# Patient Record
Sex: Male | Born: 1975 | Hispanic: Yes | Marital: Single | Smoking: Current some day smoker
Health system: Southern US, Community
[De-identification: ages and names within clinical notes are randomized; demographics above are authoritative.]

---

## 2015-07-19 ENCOUNTER — Emergency Department (HOSPITAL_COMMUNITY)
Admission: EM | Admit: 2015-07-19 | Discharge: 2015-07-19 | Disposition: A | Discharge status: Home / Self Care | Payer: Self-pay | Attending: Emergency Medicine | Admitting: Emergency Medicine

## 2015-07-19 ENCOUNTER — Encounter (HOSPITAL_COMMUNITY): Payer: Self-pay

## 2015-07-19 DIAGNOSIS — R740 Nonspecific elevation of levels of transaminase and lactic acid dehydrogenase [LDH]: Secondary | ICD-10-CM | POA: Insufficient documentation

## 2015-07-19 DIAGNOSIS — F17210 Nicotine dependence, cigarettes, uncomplicated: Secondary | ICD-10-CM | POA: Insufficient documentation

## 2015-07-19 DIAGNOSIS — K2970 Gastritis, unspecified, without bleeding: Secondary | ICD-10-CM | POA: Insufficient documentation

## 2015-07-19 DIAGNOSIS — R7401 Elevation of levels of liver transaminase levels: Secondary | ICD-10-CM

## 2015-07-19 LAB — CBC
HEMATOCRIT: 45.8 % (ref 39.0–52.0)
HEMOGLOBIN: 16.3 g/dL (ref 13.0–17.0)
MCH: 31.7 pg (ref 26.0–34.0)
MCHC: 35.6 g/dL (ref 30.0–36.0)
MCV: 88.9 fL (ref 78.0–100.0)
Platelets: 241 10*3/uL (ref 150–400)
RBC: 5.15 MIL/uL (ref 4.22–5.81)
RDW: 12.5 % (ref 11.5–15.5)
WBC: 8.4 10*3/uL (ref 4.0–10.5)

## 2015-07-19 LAB — COMPREHENSIVE METABOLIC PANEL
ALBUMIN: 4.4 g/dL (ref 3.5–5.0)
ALK PHOS: 82 U/L (ref 38–126)
ALT: 68 U/L — ABNORMAL HIGH (ref 17–63)
AST: 65 U/L — AB (ref 15–41)
Anion gap: 8 (ref 5–15)
BILIRUBIN TOTAL: 0.8 mg/dL (ref 0.3–1.2)
BUN: 6 mg/dL (ref 6–20)
CHLORIDE: 102 mmol/L (ref 101–111)
CO2: 26 mmol/L (ref 22–32)
Calcium: 9.5 mg/dL (ref 8.9–10.3)
Creatinine, Ser: 0.76 mg/dL (ref 0.61–1.24)
GFR calc Af Amer: >60 mL/min (ref >60)
GFR calc non Af Amer: >60 mL/min (ref >60)
GLUCOSE: 134 mg/dL — AB (ref 65–99)
POTASSIUM: 4.1 mmol/L (ref 3.5–5.1)
Sodium: 136 mmol/L (ref 135–145)
TOTAL PROTEIN: 7.9 g/dL (ref 6.5–8.1)

## 2015-07-19 LAB — LIPASE, BLOOD: Lipase: 18 U/L (ref 11–51)

## 2015-07-19 LAB — URINALYSIS, ROUTINE W REFLEX MICROSCOPIC
Bilirubin Urine: NEGATIVE
GLUCOSE, UA: NEGATIVE mg/dL
Hgb urine dipstick: NEGATIVE
KETONES UR: NEGATIVE mg/dL
LEUKOCYTES UA: NEGATIVE
NITRITE: NEGATIVE
PH: 8 (ref 5.0–8.0)
Protein, ur: NEGATIVE mg/dL
SPECIFIC GRAVITY, URINE: 1.016 (ref 1.005–1.030)

## 2015-07-19 MED ORDER — OMEPRAZOLE 20 MG PO CPDR: 20.0000 mg | DELAYED_RELEASE_CAPSULE | Freq: Two times a day (BID) | ORAL | Status: DC

## 2015-07-19 MED ORDER — ONDANSETRON 4 MG PO TBDP
4.0000 mg | ORAL_TABLET | Freq: Once | ORAL | Status: AC | PRN
  Administered 2015-07-19: 4 mg via ORAL

## 2015-07-19 MED ORDER — PANTOPRAZOLE SODIUM 40 MG PO TBEC
40.0000 mg | DELAYED_RELEASE_TABLET | Freq: Every day | ORAL | Status: DC
  Administered 2015-07-19: 40 mg via ORAL
  Filled 2015-07-19: qty 1

## 2015-07-19 MED ORDER — GI COCKTAIL ~~LOC~~
30.0000 mL | Freq: Once | ORAL | Status: AC
  Administered 2015-07-19: 30 mL via ORAL
  Filled 2015-07-19: qty 30

## 2015-07-19 MED ORDER — ONDANSETRON 4 MG PO TBDP
ORAL_TABLET | ORAL | Status: AC
  Filled 2015-07-19: qty 1

## 2015-07-19 NOTE — ED Provider Notes (Signed)
CSN: [id number]     Arrival date & time 07/19/15  1123 History   First MD Initiated Contact with Patient 07/19/15 1209     Chief Complaint  Patient presents with  . Abdominal Pain      HPI  Patient states "I drink too much". States he's been drinking heavily last 4-5 days and stomach hurts today. Mild nausea and epigastric pain. No back pain. No diarrhea. Has not been jaundiced. No dark urine or light stools no history of pancreatic otitis or cirrhosis.  History reviewed. No pertinent past medical history. History reviewed. No pertinent past surgical history. History reviewed. No pertinent family history. Social History  Substance Use Topics  . Smoking status: Current Some Day Smoker    Types: Cigarettes  . Smokeless tobacco: None  . Alcohol Use: Yes     Comment: 6 - 12 pack a day    Review of Systems  Constitutional: Negative for fever, chills, diaphoresis, appetite change and fatigue.  HENT: Negative for mouth sores, sore throat and trouble swallowing.   Eyes: Negative for visual disturbance.  Respiratory: Negative for cough, chest tightness, shortness of breath and wheezing.   Cardiovascular: Negative for chest pain.  Gastrointestinal: Positive for abdominal pain. Negative for nausea, vomiting, diarrhea and abdominal distention.  Endocrine: Negative for polydipsia, polyphagia and polyuria.  Genitourinary: Negative for dysuria, frequency and hematuria.  Musculoskeletal: Negative for gait problem.  Skin: Negative for color change, pallor and rash.  Neurological: Negative for dizziness, syncope, light-headedness and headaches.  Hematological: Does not bruise/bleed easily.  Psychiatric/Behavioral: Negative for behavioral problems and confusion.      Allergies  Review of patient's allergies indicates no known allergies.  Home Medications   Prior to Admission medications   Medication Sig Start Date End Date Taking? Authorizing Provider  omeprazole (PRILOSEC) 20 MG  capsule Take 1 capsule (20 mg total) by mouth 2 (two) times daily. 07/19/15   Tanna Furry, MD   BP 136/83 mmHg  Pulse 103  Temp(Src) 98 F (36.7 C) (Oral)  Resp 16  Ht 5' 6.93" (1.7 m)  Wt 199 lb 6.4 oz (90.447 kg)  BMI 31.30 kg/m2  SpO2 98% Physical Exam  Constitutional: He is oriented to person, place, and time. He appears well-developed and well-nourished. No distress.  HENT:  Head: Normocephalic.  Eyes: Conjunctivae are normal. Pupils are equal, round, and reactive to light. No scleral icterus.  Neck: Normal range of motion. Neck supple. No thyromegaly present.  Cardiovascular: Normal rate and regular rhythm.  Exam reveals no gallop and no friction rub.   No murmur heard. Pulmonary/Chest: Effort normal and breath sounds normal. No respiratory distress. He has no wheezes. He has no rales.  Abdominal: Soft. Bowel sounds are normal. He exhibits no distension. There is no tenderness. There is no rebound.  Minimal epigastric tenderness. No guarding rebound or peritoneal irritation.  Musculoskeletal: Normal range of motion.  Neurological: He is alert and oriented to person, place, and time.  Skin: Skin is warm and dry. No rash noted.  Psychiatric: He has a normal mood and affect. His behavior is normal.    ED Course  Procedures (including critical care time) Labs Review Labs Reviewed  COMPREHENSIVE METABOLIC PANEL - Abnormal; Notable for the following:    Glucose, Bld 134 (*)    AST 65 (*)    ALT 68 (*)    All other components within normal limits  LIPASE, BLOOD  CBC  URINALYSIS, ROUTINE W REFLEX MICROSCOPIC (NOT AT [facility])  Imaging Review No results found. I have personally reviewed and evaluated these images and lab results as part of my medical decision-making.   EKG Interpretation None      MDM   Final diagnoses:  Gastritis  Transaminitis   I discussed the minimal elevation of his liver enzymes and advised that he stop drinking.  Rx for Prilosec.    Tanna Furry, MD 07/19/15 [phone]

## 2015-07-19 NOTE — ED Notes (Signed)
Pt reports has had increased in beer intake in past 5 days, no beer since yesterday.  C/o abd pain, nausea.  No vomiting or diarrhea.

## 2015-07-19 NOTE — Discharge Instructions (Signed)
Your stomach is irritated from excessive alcohol use. Stop drinking. You have an irritation of your liver from excessive alcohol use. Stop drinking.   Gastritis - Adultos  (Gastritis, Adult)  Gastrittis es la hinchazn e irritacin (inflamacin) del revestimiento interno del estmago. Si no recibe tratamiento, la gastritis puede causar sangrado y llagas.(lceras) en el estmago. CUIDADOS EN EL HOGAR   Slo tome los medicamentos segn le indique el mdico.  Si le han recetado antibiticos, tmelos segn las indicaciones. Termine de [employer], aunque comience a sentirse mejor.  Beba gran cantidad de lquido para mantener el pis (orina) de tono claro o amarillo plido.  Evite las comidas y bebidas que [employer]. Los alimentos que debe evitar son:  Otelia Limes y alcohol.  Chocolate.  Menta.  Ajo y cebolla.  Comidas muy condimentadas.  Ctricos como naranjas, limones o limas.  Alimentos que contengan tomate, como salsas, [place] y pizza.  Alimentos fritos y Radio broadcast assistant.  Haga comidas pequeas durante Psychiatrist de 3 comidas abundantes. SOLICITE AYUDA DE INMEDIATO SI:   La materia fecal (heces)es negra o de color rojo oscuro.  Vomita sangre. Puede ser similar a la borra del caf  No puede retener los lquidos.  El dolor en el vientre (abdominal) empeora.  Tiene fiebre.  No mejora luego de 1 semana.  Tiene preguntas o preocupaciones. ASEGRESE DE QUE:   Comprende estas instrucciones.  Controlar su enfermedad.  Solicitar ayuda de inmediato si no mejora o si empeora.   Esta informacin no tiene Marine scientist el consejo del mdico. Asegrese de hacerle al mdico cualquier pregunta que tenga.   Document Released: 08/17/2011 Elsevier Interactive Patient Education [employer].

## 2015-08-04 ENCOUNTER — Emergency Department (HOSPITAL_COMMUNITY)
Admission: EM | Admit: 2015-08-04 | Discharge: 2015-08-04 | Disposition: A | Discharge status: Home / Self Care | Payer: Self-pay | Attending: Emergency Medicine | Admitting: Emergency Medicine

## 2015-08-04 ENCOUNTER — Encounter (HOSPITAL_COMMUNITY): Payer: Self-pay | Admitting: Emergency Medicine

## 2015-08-04 DIAGNOSIS — Z79899 Other long term (current) drug therapy: Secondary | ICD-10-CM | POA: Insufficient documentation

## 2015-08-04 DIAGNOSIS — M545 Low back pain, unspecified: Secondary | ICD-10-CM

## 2015-08-04 DIAGNOSIS — F17210 Nicotine dependence, cigarettes, uncomplicated: Secondary | ICD-10-CM | POA: Insufficient documentation

## 2015-08-04 DIAGNOSIS — Z87828 Personal history of other (healed) physical injury and trauma: Secondary | ICD-10-CM | POA: Insufficient documentation

## 2015-08-04 DIAGNOSIS — K6289 Other specified diseases of anus and rectum: Secondary | ICD-10-CM | POA: Insufficient documentation

## 2015-08-04 MED ORDER — OMEPRAZOLE 20 MG PO CPDR: DELAYED_RELEASE_CAPSULE | ORAL | Status: DC

## 2015-08-04 MED ORDER — NAPROXEN 500 MG PO TABS: 500.0000 mg | ORAL_TABLET | Freq: Two times a day (BID) | ORAL | Status: AC

## 2015-08-04 NOTE — ED Notes (Signed)
Declined W/C at D/C and was escorted to lobby by RN. 

## 2015-08-04 NOTE — ED Provider Notes (Signed)
CSN: [id number]     Arrival date & time 08/04/15  1019 History  By signing my name below, I, Cole Garza, attest that this documentation has been prepared under the direction and in the presence of Carlisle Cater, PA-C.   Electronically Signed: Tobe Garza, ED Scribe. 08/04/2015. 11:25 AM.   Chief Complaint  Patient presents with  . Rectal Pain   The history is provided by the patient. The history is limited by a language barrier. A language interpreter was used ([place]).   HPI Comments: Cole Garza is a 39 y.o. male with no pertinent PMHx who presents to the Emergency Department complaining of gradually worsening, throbbing R buttock pain onset 3 months ago. His pain radiates into his R lower back and R thigh. Pt was in the ED 2 months ago after he was struck at work with a metal railing in the right gluteal area. He reports that they gave him Naproxen and told him to ice the area. Pt reports that he did not feel any onset of pain until 5 day s/p the accident.  Pt is ambulatory w/o difficulty. He has been taking Tylenol with no relief of his pain. Pt is not on anticoagulants. History of gastritis.   History reviewed. No pertinent past medical history. History reviewed. No pertinent past surgical history. No family history on file. Social History  Substance Use Topics  . Smoking status: Current Every Day Smoker    Types: Cigarettes  . Smokeless tobacco: None  . Alcohol Use: No     Comment: quit 15 days ago     Review of Systems  Constitutional: Negative for fever and unexpected weight change.  Gastrointestinal: Negative for constipation.       Neg for fecal incontinence  Genitourinary: Negative for hematuria, flank pain and difficulty urinating.       Negative for urinary incontinence or retention  Musculoskeletal: Positive for myalgias and back pain.  Neurological: Negative for weakness and numbness.       Negative for saddle paresthesias    Allergies  Review of patient's  allergies indicates no known allergies.  Home Medications   Prior to Admission medications   Medication Sig Start Date End Date Taking? Authorizing Provider  omeprazole (PRILOSEC) 20 MG capsule Take 1 capsule (20 mg total) by mouth 2 (two) times daily. 07/19/15  Yes Tanna Furry, MD   BP 133/80 mmHg  Pulse 78  Temp(Src) 98.7 F (37.1 C) (Oral)  Ht 5\' 5"  (1.651 m)  Wt 171 lb (77.565 kg)  BMI 28.46 kg/m2  SpO2 98%   Physical Exam  Constitutional: He appears well-developed and well-nourished.  HENT:  Head: Normocephalic and atraumatic.  Eyes: Conjunctivae are normal.  Neck: Normal range of motion.  Cardiovascular: Normal rate.   Pulmonary/Chest: Effort normal. No respiratory distress.  Abdominal: Soft. He exhibits no distension. There is no tenderness. There is no CVA tenderness.  Musculoskeletal: Normal range of motion.       Thoracic back: Normal.       Lumbar back: He exhibits tenderness. He exhibits normal range of motion, no bony tenderness, no swelling and no edema.       Back:  No step-off noted with palpation of spine. Tenderness to the right gluteal area. No hematoma or induration. No erythema or signs of infection.  Neurological: He is alert. He has normal reflexes. No sensory deficit. He exhibits normal muscle tone.  5/5 strength in entire lower extremities bilaterally. No sensation deficit.   Skin: Skin is  warm and dry.  Psychiatric: He has a normal mood and affect. His behavior is normal.  Nursing note and vitals reviewed.   ED Course  Procedures (including critical care time)  DIAGNOSTIC STUDIES: Oxygen Saturation is 98% on RA, normal by my interpretation.   COORDINATION OF CARE: 11:24 AM-Discussed next steps with pt including a dosage Naproxen and Omeprazole for pain management. Pt verbalized understanding and is agreeable with the plan.   Patient prescribed muscle relaxer and counseled on proper use of muscle relaxant medication.    The patient verbalizes  understanding and agrees with the plan.  BP 133/80 mmHg  Pulse 78  Temp(Src) 98.7 F (37.1 C) (Oral)  Ht 5\' 5"  (1.651 m)  Wt 77.565 kg  BMI 28.46 kg/m2  SpO2 98%     MDM   Final diagnoses:  Right-sided low back pain without sciatica   Patient with right gluteal pain after being impacted with a metal rod at work. There are no focal rectal symptoms. Patient has had this pain for 3 months. No sciatica or radicular type symptoms. Suspect this is either a contusion or possible nerve irritation from the impact. Orthopedic follow-up given. Will start on naproxen and omeprazole. Patient counseled to discontinue these medications if he develops stomach pain. Feel benefit of NSAIDS at this point outweighs the risk of gastritis given type of injury and pain.   I personally performed the services described in this documentation, which was scribed in my presence. The recorded information has been reviewed and is accurate.      Carlisle Cater, PA-C 08/04/15 1159  Leo Grosser, MD 08/05/15 [phone]

## 2015-08-04 NOTE — ED Notes (Signed)
Patient states was hit in the buttocks at work a few weeks ago, and claims has had buttock and rectal pain since.   Patient states that he has buttock pain with pain that runs into legs.  Patient states he take tylenol at home for same with no relief.

## 2015-08-04 NOTE — Discharge Instructions (Signed)
Please read and follow all provided instructions.  Your diagnoses today include:  1. Right-sided low back pain without sciatica    Tests performed today include:  Vital signs - see below for your results today  Medications prescribed:   Naproxen - anti-inflammatory pain medication  Do not exceed 500mg  naproxen every 12 hours, take with food  You have been prescribed an anti-inflammatory medication or NSAID. Take with food. Take smallest effective dose for the shortest duration needed for your pain. Stop taking if you experience stomach pain or vomiting.    Omeprazole (Prilosec) - stomach acid reducer  This medication can be found over-the-counter  Take any prescribed medications only as directed.  Home care instructions:   Follow any educational materials contained in this packet  Please rest, use ice or heat on your back for the next several days  Do not lift, push, pull anything more than 10 pounds for the next week  Follow-up instructions: Please follow-up with your primary care provider in the next 1 week for further evaluation of your symptoms.   Return instructions:  SEEK IMMEDIATE MEDICAL ATTENTION IF YOU HAVE:  New numbness, tingling, weakness, or problem with the use of your arms or legs  Severe back pain not relieved with medications  Loss control of your bowels or bladder  Increasing pain in any areas of the body (such as chest or abdominal pain)  Shortness of breath, dizziness, or fainting.   Worsening nausea (feeling sick to your stomach), vomiting, fever, or sweats  Any other emergent concerns regarding your health   Additional Information:  Your vital signs today were: BP 133/80 mmHg   Pulse 78   Temp(Src) 98.7 F (37.1 C) (Oral)   Ht 5\' 5"  (1.651 m)   Wt 77.565 kg   BMI 28.46 kg/m2   SpO2 98% If your blood pressure (BP) was elevated above 135/85 this visit, please have this repeated by your doctor within one month. -------------- [employer] 211 is a great source of information about community services available.  Access by dialing 2-1-1 from anywhere in [place], or by website -  CustodianSupply.fi.   Other Local Resources (Updated 03/2015)  [place]    Phone Number and Address  [facility] - 1st and 3rd Saturday of every month  Must not qualify for public or private insurance and must have limited income [phone] [address] [place], [place]  Child care  Emergency assistance for housing and [employer]  Medicaid [phone] [address] [place], [place] [address]   [facility] Department  Low-cost medical care for children, communicable diseases, sexually-transmitted diseases, immunizations, maternity care, womens health and family planning [phone] [address] [place], [place] [address]  [facility]   Medication assistance for [facility] residents  Must meet income requirements [phone] [place], [place].    [place]  Child care  Emergency assistance for housing and [employer]  Medicaid [phone] [address] [place], [place] [address]  [facility] [place]   Low-cost medical care,   Monday through Friday, 9 am to 6 pm.   Accepts Medicare/Medicaid, and self-pay [phone] [address] [place], [place] [address]  [facility] for [place] medical care - Monday through Friday, 8:30 am -  5:30 pm  Accepts Medicaid and self-pay [phone] [address] [place], [place] [address]   [facility]  Primary medical care, including for those with sickle cell disease  Accepts Medicare, Medicaid,  insurance and self-pay [phone] [place], [place]  [facility]   Primary medical care  Accepts Medicare, [place], insurance and self-pay [phone] [address], [place], [place] [address]   [facility] Department of Social Services  Child care  Emergency assistance for housing and [employer]  Medicaid [phone] [address] [place], [place] [address]  [place] Department of Health and [employer]  Child care  Emergency assistance for housing and [employer]  Medicaid [phone] [place], [place] [address]   [facility] Medication Assistance Program  Medication assistance for [facility] residents with no insurance only  Must have a primary care doctor [phone] [address], [place], [place]  [facility]   Primary medical care  [place], [place], insurance  [phone] [place], [place]  [facility]   Medication assistance [id number]  [facility]   Primary medical care  Accepts Medicare, [place], insurance and self-pay [phone] [address], [place] [address]  [facility]   Primary medical care  Accepts Medicare, [place], insurance and self-pay [phone] [address] [place], [place] [address]  [facility]  For [place] residents between the ages of 45 and 85 who do not have any form of health insurance, Medicare, [place], or [place] benefits.  Services are provided free of charge to uninsured patients who fall within federal poverty guidelines.    Hours: Tuesdays and Thursdays, 4:15 - 8 pm [phone] [address] [place], [place] [address]  [place]     Primary medical care  Dental care  Nutritional counseling  Pharmacy  Accepts Medicaid, Medicare, most  insurance.  Fees are adjusted based on ability to pay.   [place] [address] [place], [place] [address], [place], [address] [facility], [place], [facility] [place], [place]  [facility]  Womens health and family planning [phone] [place]. [place], [place] care  Emergency assistance for housing and [employer]  Medicaid [phone] [address], [place], [place] [address]   [facility]    Ages 6 and older  Hours: Mondays and Thursdays, 7:00 am - 9:00 am Patients are seen on a first come, first served basis. [phone], ext. [place], [place]  Child care  Emergency assistance for housing and [employer]  Medicaid [phone] [address], [place] [address]  [employer] [facility]  Medication assistance  Rental assistance  Food pantry  Medication assistance  Housing assistance  Emergency food distribution  Utility assistance [place], [place]  [address]. [place], [place] [address] Hours: Tuesdays and Thursdays from 9am - 12 noon by appointment only  [place],  [address]  [facility] -  [place] private insurance, Medicare, and Medicaid.  Payment is based on a sliding scale for those without insurance.  Hours: Mondays, Tuesdays and Thursdays, 8:30 am - 5:30 pm.   [phone] [place], [place]  [facility] - Family Medicine at [facility], [place], and [place].  Payment is based on a sliding scale for those without insurance. [phone] [address] [place], [place]  [facility] - Pediatrics at [address] Scientist, research (medical), [employer], and [place].  Payment is based on a sliding scale for those without insurance [phone] [address] [place], [employer], [place]  [facility] - Pediatrics at [employer], [place], and [place].  Payment is based on a sliding scale for those without insurance. [phone] [place], [place]  [facility] - Pediatrics at [facility] [place], [place], and [place].  Payment is based on a sliding scale for those without insurance. [phone], ext. [phone] [place], [place].    [place] care.  Accepts Medicaid and self-pay. [place], [place]

## 2015-10-26 ENCOUNTER — Emergency Department (HOSPITAL_COMMUNITY)
Admission: EM | Admit: 2015-10-26 | Discharge: 2015-10-26 | Disposition: A | Discharge status: Home / Self Care | Payer: Self-pay | Attending: Emergency Medicine | Admitting: Emergency Medicine

## 2015-10-26 ENCOUNTER — Encounter (HOSPITAL_COMMUNITY): Payer: Self-pay | Admitting: Emergency Medicine

## 2015-10-26 DIAGNOSIS — K219 Gastro-esophageal reflux disease without esophagitis: Secondary | ICD-10-CM | POA: Insufficient documentation

## 2015-10-26 DIAGNOSIS — F17210 Nicotine dependence, cigarettes, uncomplicated: Secondary | ICD-10-CM | POA: Insufficient documentation

## 2015-10-26 LAB — CBC
HEMATOCRIT: 46.1 % (ref 39.0–52.0)
HEMOGLOBIN: 16.1 g/dL (ref 13.0–17.0)
MCH: 31.5 pg (ref 26.0–34.0)
MCHC: 34.9 g/dL (ref 30.0–36.0)
MCV: 90.2 fL (ref 78.0–100.0)
PLATELETS: 248 10*3/uL (ref 150–400)
RBC: 5.11 MIL/uL (ref 4.22–5.81)
RDW: 12.6 % (ref 11.5–15.5)
WBC: 6.7 10*3/uL (ref 4.0–10.5)

## 2015-10-26 LAB — COMPREHENSIVE METABOLIC PANEL
ALT: 29 U/L (ref 17–63)
AST: 26 U/L (ref 15–41)
Albumin: 4.4 g/dL (ref 3.5–5.0)
Alkaline Phosphatase: 77 U/L (ref 38–126)
Anion gap: 7 (ref 5–15)
BUN: 12 mg/dL (ref 6–20)
CHLORIDE: 107 mmol/L (ref 101–111)
CO2: 24 mmol/L (ref 22–32)
CREATININE: 0.75 mg/dL (ref 0.61–1.24)
Calcium: 9.3 mg/dL (ref 8.9–10.3)
GFR calc non Af Amer: >60 mL/min (ref >60)
Glucose, Bld: 110 mg/dL — ABNORMAL HIGH (ref 65–99)
POTASSIUM: 3.7 mmol/L (ref 3.5–5.1)
SODIUM: 138 mmol/L (ref 135–145)
Total Bilirubin: 0.9 mg/dL (ref 0.3–1.2)
Total Protein: 7.4 g/dL (ref 6.5–8.1)

## 2015-10-26 LAB — URINALYSIS, ROUTINE W REFLEX MICROSCOPIC
Bilirubin Urine: NEGATIVE
GLUCOSE, UA: NEGATIVE mg/dL
HGB URINE DIPSTICK: NEGATIVE
Ketones, ur: 15 mg/dL — AB
LEUKOCYTES UA: NEGATIVE
Nitrite: NEGATIVE
PH: 6 (ref 5.0–8.0)
Protein, ur: NEGATIVE mg/dL
SPECIFIC GRAVITY, URINE: 1.024 (ref 1.005–1.030)

## 2015-10-26 LAB — LIPASE, BLOOD: LIPASE: 25 U/L (ref 11–51)

## 2015-10-26 MED ORDER — OMEPRAZOLE 20 MG PO CPDR: 20.0000 mg | DELAYED_RELEASE_CAPSULE | Freq: Every day | ORAL | 1 refills | Status: AC

## 2015-10-26 MED ORDER — GI COCKTAIL ~~LOC~~
30.0000 mL | Freq: Once | ORAL | Status: AC
  Administered 2015-10-26: 30 mL via ORAL
  Filled 2015-10-26: qty 30

## 2015-10-26 NOTE — ED Triage Notes (Signed)
Pt. Stated, I have stomach pain for 3 days.

## 2015-10-26 NOTE — ED Provider Notes (Signed)
[facility] DEPT Provider Note   CSN: [id number] Arrival date & time: 10/26/15  1134     History   Chief Complaint Chief Complaint  Patient presents with  . Abdominal Pain    HPI Cole Garza is a 40 y.o. male.  Cole Garza is a 40 y.o. Male who presents to the ED complaining of three days of stomach burning. He denies any abdominal pain. He reports he feels burning and warmth to his stomach. He thinks acid reflux. He reports some feelings of nausea, but no vomiting. No diarrhea. He tells me he was seen several months ago and was encouraged to stop drinking. He reports he stopped drinking 3 months ago. He is no longer taking any medicines for acid reflux. He has attempted no treatments prior to arrival today. He reports he feels hungry. He tells me he would like a health check physical. He denies previous abdominal surgeries. He denies fevers, urinary symptoms, vomiting, diarrhea, rashes, chest pain, shortness of breath, palpitations, lightheadedness, dizziness.    The history is provided by the patient. The history is limited by a language barrier. A language interpreter was used.  Abdominal Pain   Associated symptoms include nausea. Pertinent negatives include fever, diarrhea, vomiting, dysuria, frequency, hematuria and headaches.    History reviewed. No pertinent past medical history.  There are no active problems to display for this patient.   History reviewed. No pertinent surgical history.     Home Medications    Prior to Admission medications   Medication Sig Start Date End Date Taking? Authorizing Provider  naproxen (NAPROSYN) 500 MG tablet Take 1 tablet (500 mg total) by mouth 2 (two) times daily. 08/04/15   Carlisle Cater, PA-C  omeprazole (PRILOSEC) 20 MG capsule Take 1 capsule (20 mg total) by mouth daily. 10/26/15   Waynetta Pean, PA-C    Family History No family history on file.  Social History Social History  Substance Use Topics  . Smoking status:  Current Some Day Smoker    Types: Cigarettes  . Smokeless tobacco: Former Systems developer  . Alcohol use No     Comment: quit 15 days ago      Allergies   Review of patient's allergies indicates no known allergies.   Review of Systems Review of Systems  Constitutional: Negative for chills and fever.  HENT: Negative for congestion and sore throat.   Eyes: Negative for visual disturbance.  Respiratory: Negative for cough and shortness of breath.   Cardiovascular: Negative for chest pain and palpitations.  Gastrointestinal: Positive for abdominal pain and nausea. Negative for blood in stool, diarrhea and vomiting.  Genitourinary: Negative for difficulty urinating, dysuria, flank pain, frequency and hematuria.  Musculoskeletal: Negative for back pain and neck pain.  Skin: Negative for rash.  Neurological: Negative for light-headedness and headaches.     Physical Exam Updated Vital Signs BP 114/76 (BP Location: Left Arm)   Pulse 75   Temp 98.2 F (36.8 C) (Oral)   Resp 20   SpO2 98%   Physical Exam  Constitutional: He appears well-developed and well-nourished. No distress.  Nontoxic appearing.  HENT:  Head: Normocephalic and atraumatic.  Mouth/Throat: Oropharynx is clear and moist.  Eyes: Conjunctivae are normal. Pupils are equal, round, and reactive to light. Right eye exhibits no discharge. Left eye exhibits no discharge.  Neck: Neck supple. No JVD present.  Cardiovascular: Normal rate, regular rhythm, normal heart sounds and intact distal pulses.  Exam reveals no gallop and no friction rub.   No  murmur heard. Pulmonary/Chest: Effort normal and breath sounds normal. No stridor. No respiratory distress. He has no wheezes. He has no rales.  Abdominal: Soft. Bowel sounds are normal. He exhibits no distension and no mass. There is no tenderness. There is no guarding.  Abdomen is soft and nontender to palpation. Bowel sounds are present. No CVA or flank tenderness.  Musculoskeletal: He  exhibits no edema.  Lymphadenopathy:    He has no cervical adenopathy.  Neurological: He is alert. Coordination normal.  Skin: Skin is warm and dry. Capillary refill takes less than 2 seconds. No rash noted. He is not diaphoretic. No erythema. No pallor.  Psychiatric: He has a normal mood and affect. His behavior is normal.  Nursing note and vitals reviewed.    ED Treatments / Results  Labs (all labs ordered are listed, but only abnormal results are displayed) Labs Reviewed  COMPREHENSIVE METABOLIC PANEL - Abnormal; Notable for the following:       Result Value   Glucose, Bld 110 (*)    All other components within normal limits  URINALYSIS, ROUTINE W REFLEX MICROSCOPIC (NOT AT [facility]) - Abnormal; Notable for the following:    Ketones, ur 15 (*)    All other components within normal limits  LIPASE, BLOOD  CBC    EKG  EKG Interpretation None       Radiology No results found.  Procedures Procedures (including critical care time)  Medications Ordered in ED Medications  gi cocktail (Maalox,Lidocaine,Donnatal) (30 mLs Oral Given 10/26/15 1757)     Initial Impression / Assessment and Plan / ED Course  I have reviewed the triage vital signs and the nursing notes.  Pertinent labs & imaging results that were available during my care of the patient were reviewed by me and considered in my medical decision making (see chart for details).  Clinical Course   The patient presented to the emergency department with symptoms of acid reflux. He denies any abdominal pain or vomiting. He describes it as a burning and warm sensation in his stomach. On exam the patient is afebrile and nontoxic appearing. His abdomen is soft and nontender to palpation. No epigastric tenderness. No right upper quadrant tenderness to palpation. Urinalysis showed no sign of infection. Lipase is within normal limits. CMP is unremarkable. Patient had previous elevations of his liver enzymes that have resolved.  The patient reports he is no longer drinking. CBC is within normal limits. Patient was provided with GI cocktail and reports this resolved his pain. He tolerated by mouth in the emergency department without nausea or vomiting. He reports feeling well and ready for discharge. We'll discharge a prescription for omeprazole and coupon for the medicines so that it costs $4. I discussed that I believe this is acid reflux and discussed diet instructions that could help with his symptoms. I encouraged him to [facility] for [facility] care. I discussed return precautions. I advised the patient to follow-up with their primary care provider this week. I advised the patient to return to the emergency department with new or worsening symptoms or new concerns. The patient verbalized understanding and agreement with plan.      Final Clinical Impressions(s) / ED Diagnoses   Final diagnoses:  Gastroesophageal reflux disease, esophagitis presence not specified    New Prescriptions New Prescriptions   OMEPRAZOLE (PRILOSEC) 20 MG CAPSULE    Take 1 capsule (20 mg total) by mouth daily.     Waynetta Pean, PA-C 10/26/15 1827  Charlesetta Shanks, MD 11/18/15 [phone]

## 2015-12-14 ENCOUNTER — Emergency Department (HOSPITAL_COMMUNITY)
Admission: EM | Admit: 2015-12-14 | Discharge: 2015-12-14 | Disposition: A | Discharge status: Home / Self Care | Payer: Self-pay | Attending: Emergency Medicine | Admitting: Emergency Medicine

## 2015-12-14 ENCOUNTER — Encounter (HOSPITAL_COMMUNITY): Payer: Self-pay

## 2015-12-14 DIAGNOSIS — N5089 Other specified disorders of the male genital organs: Secondary | ICD-10-CM | POA: Insufficient documentation

## 2015-12-14 DIAGNOSIS — R1030 Lower abdominal pain, unspecified: Secondary | ICD-10-CM

## 2015-12-14 DIAGNOSIS — K469 Unspecified abdominal hernia without obstruction or gangrene: Secondary | ICD-10-CM | POA: Insufficient documentation

## 2015-12-14 DIAGNOSIS — F17210 Nicotine dependence, cigarettes, uncomplicated: Secondary | ICD-10-CM | POA: Insufficient documentation

## 2015-12-14 LAB — URINALYSIS, ROUTINE W REFLEX MICROSCOPIC
BILIRUBIN URINE: NEGATIVE
GLUCOSE, UA: NEGATIVE mg/dL
HGB URINE DIPSTICK: NEGATIVE
Ketones, ur: NEGATIVE mg/dL
Leukocytes, UA: NEGATIVE
Nitrite: NEGATIVE
PROTEIN: NEGATIVE mg/dL
SPECIFIC GRAVITY, URINE: 1.025 (ref 1.005–1.030)
pH: 5.5 (ref 5.0–8.0)

## 2015-12-14 MED ORDER — TRIAMCINOLONE ACETONIDE 0.025 % EX OINT: 1.0000 "application " | TOPICAL_OINTMENT | Freq: Two times a day (BID) | CUTANEOUS | 1 refills | Status: AC

## 2015-12-14 MED ORDER — IBUPROFEN 600 MG PO TABS: 600.0000 mg | ORAL_TABLET | Freq: Four times a day (QID) | ORAL | 0 refills | Status: AC | PRN

## 2015-12-14 NOTE — ED Provider Notes (Signed)
[facility] DEPT Provider Note   CSN: [id number] Arrival date & time: 12/14/15  1003    By signing my name below, I, Eunice Blase, attest that this documentation has been prepared under the direction and in the presence of Harlene Ramus. Electronically Signed: Eunice Blase, Scribe. 12/14/15. 11:54 AM.  History   Chief Complaint Chief Complaint  Patient presents with  . Testicle Pain    Itching   The history is provided by the patient. A language interpreter was used.   Cole Garza is a 40 y.o. male who presents to the Emergency Department complaining of persistent testicular itching for the past 14 days. He reports shaving with a razor for the first time 15 days ago And notes his itching occurred the following day. Denies drainage. Reports he has been using hydrocortisone cream without relief. He also reports having mild intermittent dysuria over the past 1-2 weeks. He states that he is sexually active with one partner, and he does not use condoms. He denies similar symptoms in the past. Denies fever, abdominal pain, vomiting, urinary symptoms, hematuria, penile or testicular pain/swelling, penile discharge.   History reviewed. No pertinent past medical history.  There are no active problems to display for this patient.   History reviewed. No pertinent surgical history.     Home Medications    Prior to Admission medications   Medication Sig Start Date End Date Taking? Authorizing Provider  ibuprofen (ADVIL,MOTRIN) 600 MG tablet Take 1 tablet (600 mg total) by mouth every 6 (six) hours as needed. 12/14/15   Nona Dell, PA-C  naproxen (NAPROSYN) 500 MG tablet Take 1 tablet (500 mg total) by mouth 2 (two) times daily. 08/04/15   Carlisle Cater, PA-C  omeprazole (PRILOSEC) 20 MG capsule Take 1 capsule (20 mg total) by mouth daily. 10/26/15   Waynetta Pean, PA-C  triamcinolone (KENALOG) 0.025 % ointment Apply 1 application topically 2 (two) times daily. Do not  apply to face 12/14/15   Nona Dell, PA-C    Family History History reviewed. No pertinent family history.  Social History Social History  Substance Use Topics  . Smoking status: Current Some Day Smoker    Types: Cigarettes  . Smokeless tobacco: Former Systems developer  . Alcohol use No     Comment: quit 15 days ago      Allergies   Review of patient's allergies indicates no known allergies.   Review of Systems Review of Systems  Gastrointestinal: Negative for abdominal pain.  Genitourinary: Positive for dysuria. Negative for discharge, penile pain, penile swelling and scrotal swelling.  Skin: Positive for rash.     Physical Exam Updated Vital Signs BP 123/79 (BP Location: Right Arm)   Pulse 68   Temp 97.9 F (36.6 C) (Oral)   Resp 16   SpO2 100%   Physical Exam  Constitutional: He is oriented to person, place, and time. He appears well-developed and well-nourished.  HENT:  Head: Normocephalic and atraumatic.  Mouth/Throat: Uvula is midline, oropharynx is clear and moist and mucous membranes are normal. No oral lesions. No oropharyngeal exudate, posterior oropharyngeal edema, posterior oropharyngeal erythema or tonsillar abscesses. No tonsillar exudate.  Eyes: Conjunctivae and EOM are normal. Right eye exhibits no discharge. Left eye exhibits no discharge. No scleral icterus.  Neck: Normal range of motion. Neck supple.  Cardiovascular: Normal rate, regular rhythm, normal heart sounds and intact distal pulses.   Pulmonary/Chest: Effort normal and breath sounds normal. No respiratory distress. He has no wheezes. He has no rales.  He exhibits no tenderness.  Abdominal: Soft. Bowel sounds are normal. He exhibits no distension and no mass. There is no tenderness. There is no rebound and no guarding. A hernia is present. Hernia confirmed negative in the right inguinal area and confirmed negative in the left inguinal area.  Genitourinary: Penis normal. Right testis shows no  mass, no swelling and no tenderness. Left testis shows no mass, no swelling and no tenderness. Circumcised. No penile erythema or penile tenderness. No discharge found.  Genitourinary Comments: Few small raised papular hair follicles noted to left groin lateral to scrotum with no surrounding swelling, erythema, warmth, fluctuance, or drainage  Musculoskeletal: Normal range of motion. He exhibits no edema.  Lymphadenopathy: No inguinal adenopathy noted on the right or left side.  Neurological: He is alert and oriented to person, place, and time.  Skin: Skin is warm and dry.  Nursing note and vitals reviewed.    ED Treatments / Results  DIAGNOSTIC STUDIES: Oxygen Saturation is 100% on RA, normal by my interpretation.    COORDINATION OF CARE: 11:54 AM Will order labs. Discussed treatment plan with pt at bedside and pt agreed to plan.    Labs (all labs ordered are listed, but only abnormal results are displayed) Labs Reviewed  URINE CULTURE  URINALYSIS, ROUTINE W REFLEX MICROSCOPIC (NOT AT [facility])  RPR  HIV ANTIBODY (ROUTINE TESTING)  GC/CHLAMYDIA PROBE AMP ([place]) NOT AT [facility]    EKG  EKG Interpretation None       Radiology No results found.  Procedures Procedures (including critical care time)  Medications Ordered in ED Medications - No data to display   Initial Impression / Assessment and Plan / ED Course  I have reviewed the triage vital signs and the nursing notes.  Pertinent labs & imaging results that were available during my care of the patient were reviewed by me and considered in my medical decision making (see chart for details).  Clinical Course    Patient presents with reported itching rash to groin area after shaving with a razor for the first time 2 weeks ago. Endorses intermittent dysuria. Denies fever, abdominal pain, penile discharge. VSS. Exam revealed few raised papular hair follicles to left groin region with no evidence of associated  cellulitis or abscess; consistent with razor burn. GU exam otherwise unremarkable. Abdominal exam benign. Labs obtained for GC/chlamydia, HIV and syphilis. UA unremarkable. Plan to discharge patient home with symptomatic treatment for razor burn including Kenalog cream and advised to refrain from shaving until his symptoms have resolved completely. Discussed pending labs with patient. Patient given information to follow up with PCP as needed. Discussed return precautions.  Final Clinical Impressions(s) / ED Diagnoses   Final diagnoses:  Inguinal pain, unspecified laterality    New Prescriptions Discharge Medication List as of 12/14/2015  1:09 PM    START taking these medications   Details  ibuprofen (ADVIL,MOTRIN) 600 MG tablet Take 1 tablet (600 mg total) by mouth every 6 (six) hours as needed., Starting Sun 12/14/2015, Print    triamcinolone (KENALOG) 0.025 % ointment Apply 1 application topically 2 (two) times daily. Do not apply to face, Starting Sun 12/14/2015, Print         I personally performed the services described in this documentation, which was scribed in my presence. The recorded information has been reviewed and is accurate.    Chesley Noon [place], [place] 12/14/15 1627    Julianne Rice, MD 12/15/15 [phone]

## 2015-12-14 NOTE — ED Triage Notes (Signed)
Pt reports itching in the testicle area. He reports he tried hydrocortisone cream without improvement. Pt denies any pain.

## 2015-12-14 NOTE — ED Notes (Signed)
Declined W/C at D/C and was escorted to lobby by RN. 

## 2015-12-14 NOTE — Discharge Instructions (Signed)
Take your medication as prescribed as needed for pain relief. I recommend applying the cream to affected area once or twice daily for the next 1-2 weeks. I recommend refraining from shaving your groin region for the next few weeks until your symptoms have completely resolved. You have been tested for HIV, syphilis, chlamydia and gonorrhea which the results are pending.  These results will be available in approximately 3 days.  Please inform all sexual partners if you test positive for any of these diseases.  Please return to the Emergency Department if symptoms worsen or new onset of fever, abdominal pain, vomiting, pain or burning with urination, blood in urine, penile discharge, penile or testicular pain or swelling, new or worsening rash.

## 2015-12-15 LAB — URINE CULTURE: Culture: NO GROWTH

## 2015-12-15 LAB — GC/CHLAMYDIA PROBE AMP (~~LOC~~) NOT AT ARMC
Chlamydia: NEGATIVE
Neisseria Gonorrhea: NEGATIVE

## 2015-12-15 LAB — HIV ANTIBODY (ROUTINE TESTING W REFLEX): HIV SCREEN 4TH GENERATION: NONREACTIVE

## 2015-12-15 LAB — RPR: RPR: NONREACTIVE

## 2016-03-12 ENCOUNTER — Emergency Department (HOSPITAL_COMMUNITY): Payer: Self-pay

## 2016-03-12 ENCOUNTER — Emergency Department (HOSPITAL_COMMUNITY)
Admission: EM | Admit: 2016-03-12 | Discharge: 2016-03-12 | Disposition: A | Discharge status: Home / Self Care | Payer: Self-pay | Attending: Emergency Medicine | Admitting: Emergency Medicine

## 2016-03-12 DIAGNOSIS — F17210 Nicotine dependence, cigarettes, uncomplicated: Secondary | ICD-10-CM | POA: Insufficient documentation

## 2016-03-12 DIAGNOSIS — D171 Benign lipomatous neoplasm of skin and subcutaneous tissue of trunk: Secondary | ICD-10-CM

## 2016-03-12 DIAGNOSIS — L729 Follicular cyst of the skin and subcutaneous tissue, unspecified: Secondary | ICD-10-CM | POA: Insufficient documentation

## 2016-03-12 DIAGNOSIS — R109 Unspecified abdominal pain: Secondary | ICD-10-CM

## 2016-03-12 DIAGNOSIS — R1011 Right upper quadrant pain: Secondary | ICD-10-CM | POA: Insufficient documentation

## 2016-03-12 DIAGNOSIS — Z79899 Other long term (current) drug therapy: Secondary | ICD-10-CM | POA: Insufficient documentation

## 2016-03-12 LAB — URINALYSIS, ROUTINE W REFLEX MICROSCOPIC
Bilirubin Urine: NEGATIVE
Glucose, UA: NEGATIVE mg/dL
Hgb urine dipstick: NEGATIVE
Ketones, ur: NEGATIVE mg/dL
Leukocytes, UA: NEGATIVE
Nitrite: NEGATIVE
Protein, ur: NEGATIVE mg/dL
Specific Gravity, Urine: 1.015 (ref 1.005–1.030)
pH: 5 (ref 5.0–8.0)

## 2016-03-12 LAB — CBC
HCT: 45.1 % (ref 39.0–52.0)
Hemoglobin: 16.3 g/dL (ref 13.0–17.0)
MCH: 32.1 pg (ref 26.0–34.0)
MCHC: 36.1 g/dL — ABNORMAL HIGH (ref 30.0–36.0)
MCV: 88.8 fL (ref 78.0–100.0)
Platelets: 300 10*3/uL (ref 150–400)
RBC: 5.08 MIL/uL (ref 4.22–5.81)
RDW: 12.6 % (ref 11.5–15.5)
WBC: 8.3 10*3/uL (ref 4.0–10.5)

## 2016-03-12 LAB — COMPREHENSIVE METABOLIC PANEL
ALT: 31 U/L (ref 17–63)
AST: 21 U/L (ref 15–41)
Albumin: 4.2 g/dL (ref 3.5–5.0)
Alkaline Phosphatase: 76 U/L (ref 38–126)
Anion gap: 8 (ref 5–15)
BUN: 10 mg/dL (ref 6–20)
CO2: 25 mmol/L (ref 22–32)
Calcium: 9.3 mg/dL (ref 8.9–10.3)
Chloride: 104 mmol/L (ref 101–111)
Creatinine, Ser: 0.93 mg/dL (ref 0.61–1.24)
GFR calc Af Amer: >60 mL/min (ref >60)
GFR calc non Af Amer: >60 mL/min (ref >60)
Glucose, Bld: 100 mg/dL — ABNORMAL HIGH (ref 65–99)
Potassium: 3.8 mmol/L (ref 3.5–5.1)
Sodium: 137 mmol/L (ref 135–145)
Total Bilirubin: 0.3 mg/dL (ref 0.3–1.2)
Total Protein: 7.5 g/dL (ref 6.5–8.1)

## 2016-03-12 LAB — LIPASE, BLOOD: Lipase: 22 U/L (ref 11–51)

## 2016-03-12 NOTE — ED Notes (Addendum)
Care handoff to [place], [place]

## 2016-03-12 NOTE — ED Notes (Signed)
PT has been given urine cup and is aware where bathroom is. PT has no requests at this time.

## 2016-03-12 NOTE — ED Notes (Signed)
PT remains A&O x4. PT taken to [place] at this time.

## 2016-03-12 NOTE — ED Provider Notes (Signed)
[place] DEPT Provider Note   CSN: [id number] Arrival date & time: 03/12/16  0945     History   Chief Complaint Chief Complaint  Patient presents with  . Abdominal Pain    HPI Cole Garza is a 41 y.o. male.  The history is provided by the patient and medical records. The history is limited by a language barrier. A language interpreter was used.     41 y.o. M with hx of gastritis, here with RUQ abdominal pain.  Patient states he has felt a "knot" in his RUQ for the past 2-3 weeks.  States it causes him pain.  States it is not severe, rather just uncomfortable.  No nausea, vomiting, or diarrhea.  No fever or chills.  States he has been taking OTC pain medication at home with some relief.  No prior abdominal surgeries.  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  No past medical history on file.  There are no active problems to display for this patient.   No past surgical history on file.     Home Medications    Prior to Admission medications   Medication Sig Start Date End Date Taking? Authorizing Provider  ibuprofen (ADVIL,MOTRIN) 600 MG tablet Take 1 tablet (600 mg total) by mouth every 6 (six) hours as needed. 12/14/15   Nona Dell, PA-C  naproxen (NAPROSYN) 500 MG tablet Take 1 tablet (500 mg total) by mouth 2 (two) times daily. 08/04/15   Carlisle Cater, PA-C  omeprazole (PRILOSEC) 20 MG capsule Take 1 capsule (20 mg total) by mouth daily. 10/26/15   Waynetta Pean, PA-C  triamcinolone (KENALOG) 0.025 % ointment Apply 1 application topically 2 (two) times daily. Do not apply to face 12/14/15   Nona Dell, PA-C    Family History No family history on file.  Social History Social History  Substance Use Topics  . Smoking status: Current Some Day Smoker    Types: Cigarettes  . Smokeless tobacco: Former Systems developer  . Alcohol use No     Comment: quit 15 days ago      Allergies   Patient has no known  allergies.   Review of Systems Review of Systems  Gastrointestinal: Positive for abdominal pain.  All other systems reviewed and are negative.    Physical Exam Updated Vital Signs BP 125/71 (BP Location: Right Arm)   Pulse 69   Temp 97.8 F (36.6 C) (Oral)   Resp 18   SpO2 99%   Physical Exam  Constitutional: He is oriented to person, place, and time. He appears well-developed and well-nourished.  HENT:  Head: Normocephalic and atraumatic.  Mouth/Throat: Oropharynx is clear and moist.  Eyes: Conjunctivae and EOM are normal. Pupils are equal, round, and reactive to light.  Neck: Normal range of motion.  Cardiovascular: Normal rate, regular rhythm and normal heart sounds.   Pulmonary/Chest: Effort normal and breath sounds normal. No respiratory distress.  Abdominal: Soft. Bowel sounds are normal. There is tenderness in the right upper quadrant. There is negative Murphy's sign.  Cystic feeling structure beneath skin in RUQ which is somewhat TTP; there is no fluctuance, signs of abscess formation; no drainage; no surrounding skin changes  Musculoskeletal: Normal range of motion.  Neurological: He is alert and oriented to person, place, and time.  Skin: Skin is warm and dry.  Psychiatric: He has a normal mood and affect.  Nursing note and vitals reviewed.    ED Treatments / Results  Labs (all  labs ordered are listed, but only abnormal results are displayed) Labs Reviewed  COMPREHENSIVE METABOLIC PANEL - Abnormal; Notable for the following:       Result Value   Glucose, Bld 100 (*)    All other components within normal limits  CBC - Abnormal; Notable for the following:    MCHC 36.1 (*)    All other components within normal limits  LIPASE, BLOOD  URINALYSIS, ROUTINE W REFLEX MICROSCOPIC    EKG  EKG Interpretation None       Radiology US Abdomen Limited  Result Date: 03/12/2016 CLINICAL DATA:  Right midline abdominal wall palpable mass EXAM: LIMITED ABDOMINAL  ULTRASOUND COMPARISON:  None. FINDINGS: Limited soft tissue ultrasound abdomen performed in the region of clinical concern. In the region of the palpable abnormality is a hyperechoic area measuring 1 x 0.7 x 1.5 cm. No cysts are visualized in the subcutaneous soft tissues. IMPRESSION: Nonspecific focal hyperechoic area measuring 1.5 cm corresponding to the area clinical concern, findings are nonspecific and could relate to lipoma. Further management will need to be based on clinical grounds. Electronically Signed   By: Donavan Foil M.D.   On: 03/12/2016 19:10   US Abdomen Limited Ruq  Result Date: 03/12/2016 CLINICAL DATA:  Right upper quadrant pain EXAM: US ABDOMEN LIMITED - RIGHT UPPER QUADRANT COMPARISON:  None. FINDINGS: Gallbladder: No gallstones or wall thickening visualized. No sonographic Murphy sign noted by sonographer. Common bile duct: Diameter: 3.2 mm Liver: Increased echogenicity consistent with fatty infiltration. No focal abnormality. No ascites IMPRESSION: 1. Negative for acute cholecystitis or biliary dilatation 2. Fatty liver Electronically Signed   By: Donavan Foil M.D.   On: 03/12/2016 19:06    Procedures Procedures (including critical care time)  Medications Ordered in ED Medications - No data to display   Initial Impression / Assessment and Plan / ED Course  I have reviewed the triage vital signs and the nursing notes.  Pertinent labs & imaging results that were available during my care of the patient were reviewed by me and considered in my medical decision making (see chart for details).  Clinical Course    41 year old male here with right upper abdominal pain. He has a localized area of right upper abdomen that feels like a cyst or other structure beneath the skin. There is no apparent abscess or signs of cellulitis. He has not had any nausea, vomiting, or diarrhea.  Labwork is reassuring. Ultrasound obtained revealing fatty liver. Localized ultrasound with  non-specific findings, possible lipoma.  Patient appears stable here, no signs of acute abdomen.  Remains afebrile without N/V/D.  May need further work-up for better characterization but feel this can be done as an OP.  Patient will be discharged home with general surgery follow-up, tylenol or motrin PRN pain.  Discussed plan with patient via language interpreter, he acknowledged understanding and agreed with plan of care.  Return precautions given for new or worsening symptoms.  Final Clinical Impressions(s) / ED Diagnoses   Final diagnoses:  Abdominal pain, unspecified abdominal location  Lipoma of torso    New Prescriptions Discharge Medication List as of 03/12/2016  7:53 PM       Larene Pickett, PA-C 03/12/16 [place], PA-C 03/12/16 [id number]    Virgel Manifold, MD 03/22/16 1419

## 2016-03-12 NOTE — ED Notes (Addendum)
PT remains in [place] at this time.

## 2016-03-12 NOTE — Discharge Instructions (Signed)
Your ultrasound today was concerning for lipoma.  If this continues to be painful, you may wish to follow-up with general surgery for removal. Call their office to make appt. Return here for new concerns.

## 2016-03-12 NOTE — ED Notes (Signed)
EDP at bedside  

## 2016-03-12 NOTE — ED Notes (Signed)
Returns from US at this time

## 2016-03-12 NOTE — ED Notes (Signed)
PT reports right side/ abdominal pain that started 17 days ago. PT denies N/V/D. PT reports naprosyn has not relieved pain. PT also reports heartburn for approx 6 months.

## 2016-03-12 NOTE — ED Triage Notes (Signed)
Pt c/o RUQ abd pain onset today, pt states, "I quit drinking after christmas and it has been 17 days now my stomach hurts. " pt denies n/v/d, A&O x4

## 2018-12-10 IMAGING — US US ABDOMEN LIMITED
1 series · 14 of 25 positions shown · non-contrast
Comparison: None.

CLINICAL DATA: Right midline abdominal wall palpable mass

EXAM:
LIMITED ABDOMINAL ULTRASOUND

[Series 1: us abdomen limited · 0.22mm/px · 70 acquisitions, 14 frames shown]
[im 1/70]
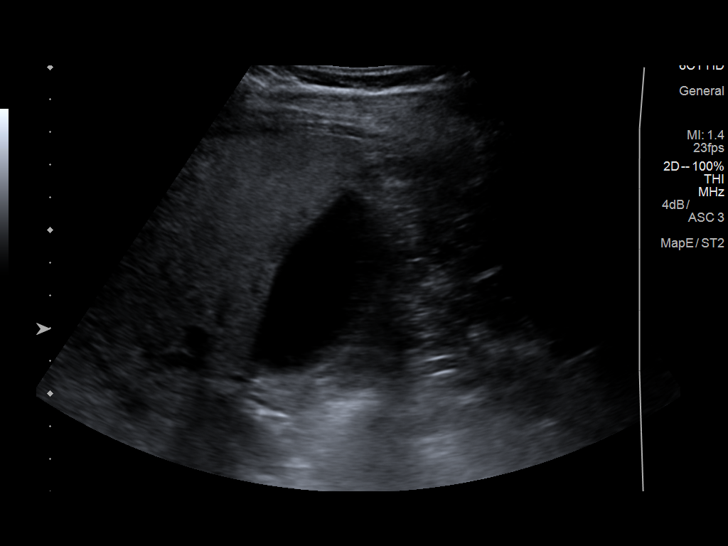
[im 6/70]
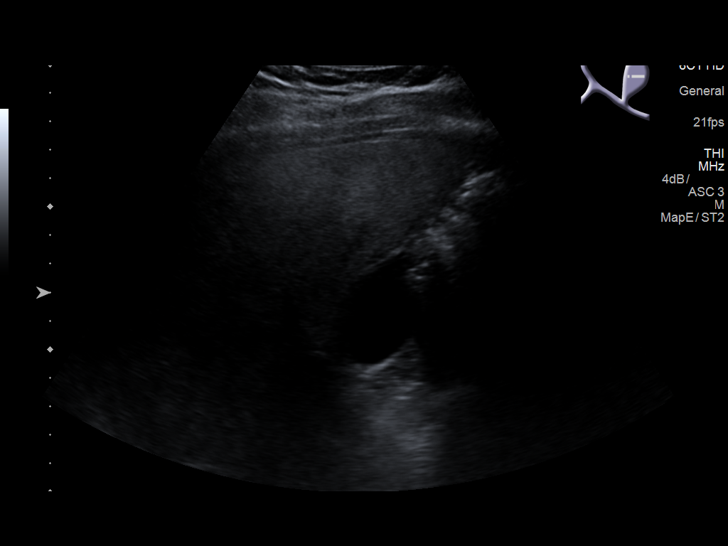
[im 12/70]
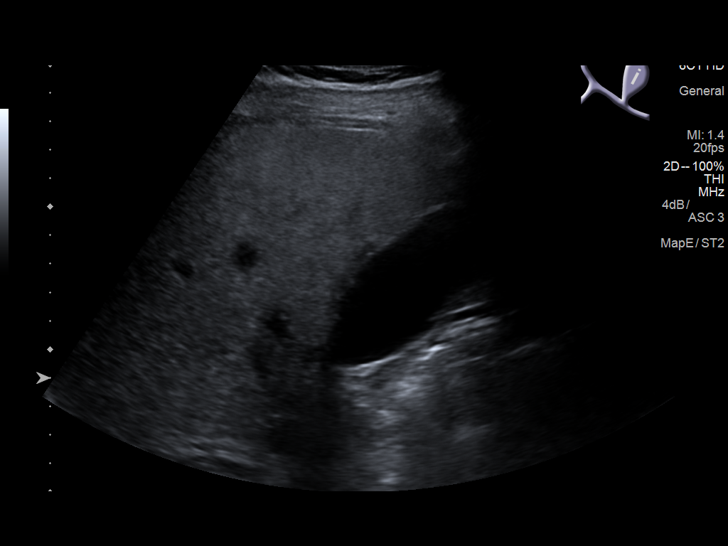
[im 18/70]
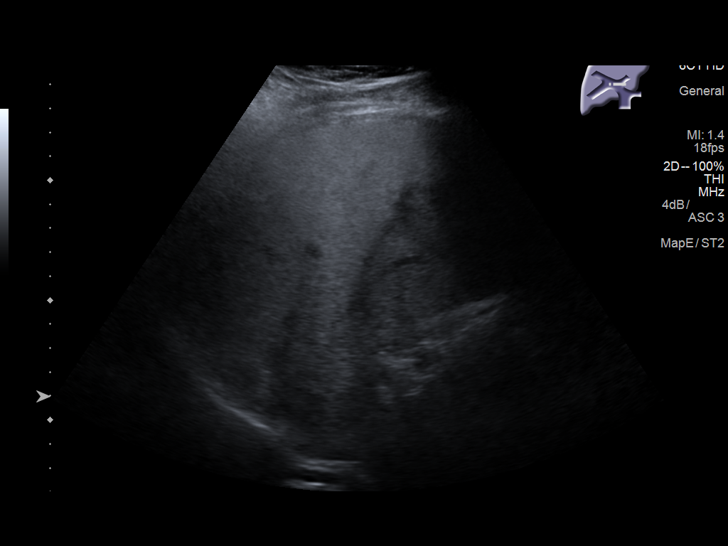
[im 24/70]
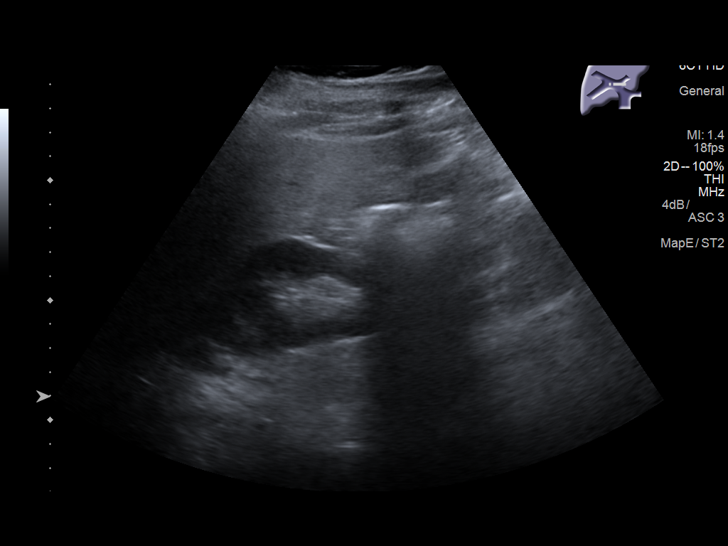
[im 26/70]
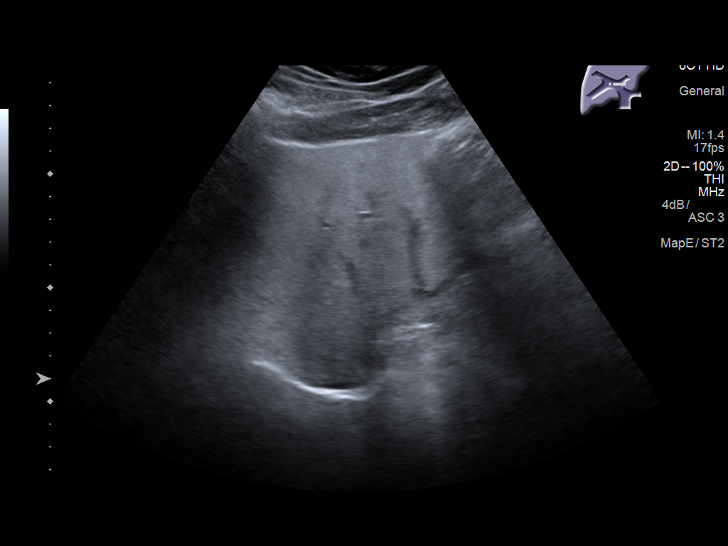
[im 32/70]
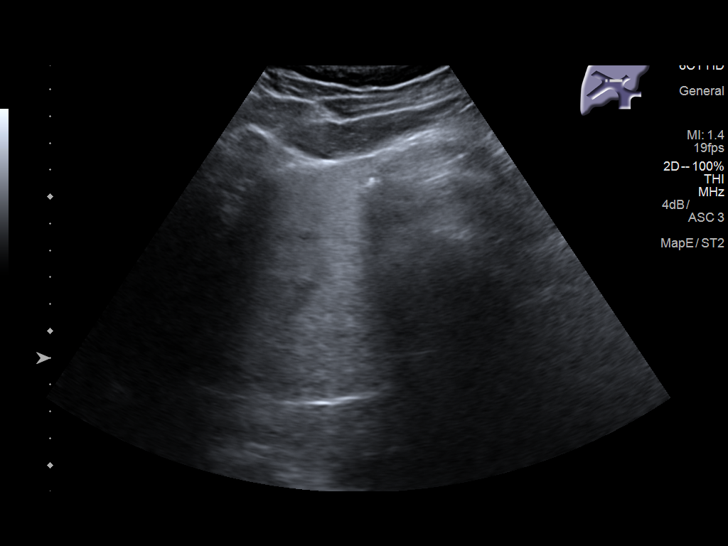
[im 38/70]
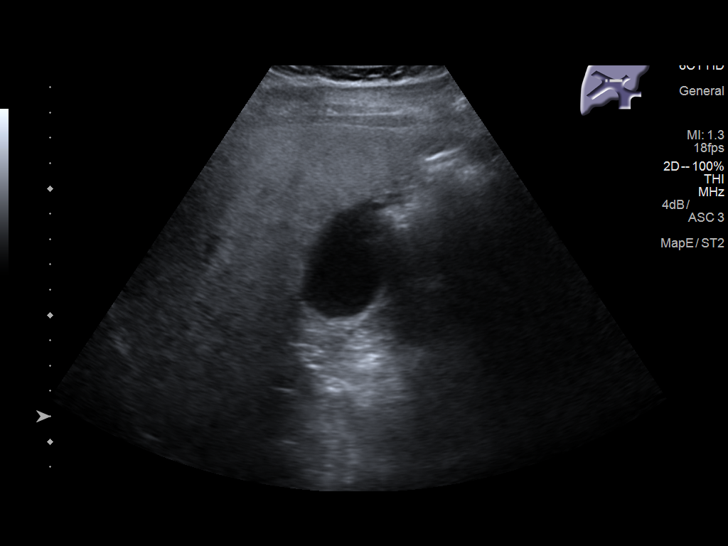
[im 44/70]
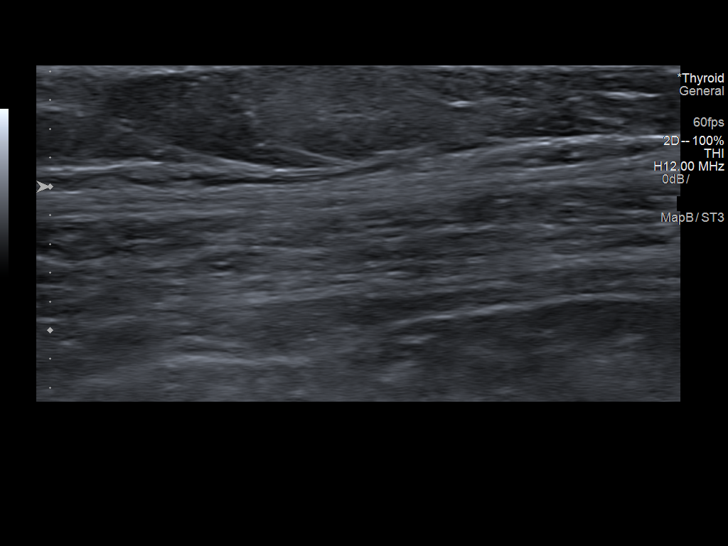
[im 47/70]
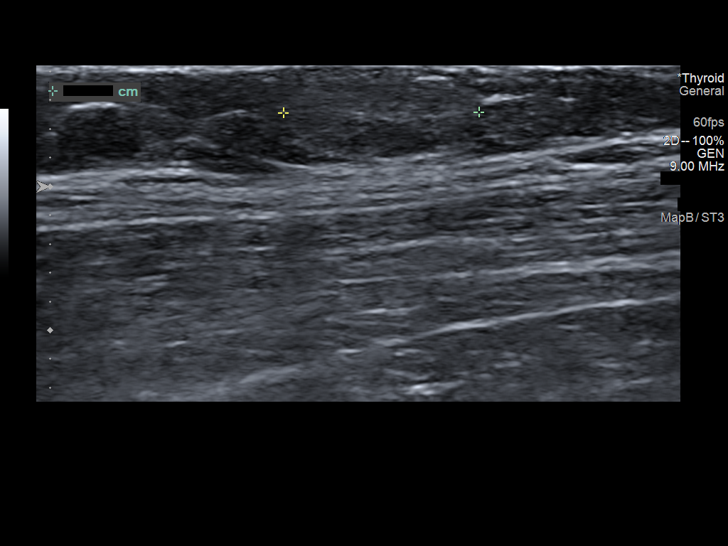
[im 52/70]
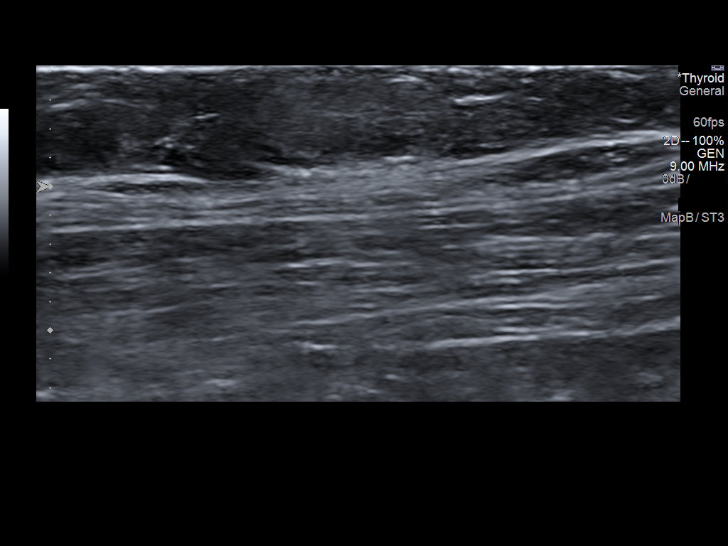
[im 58/70]
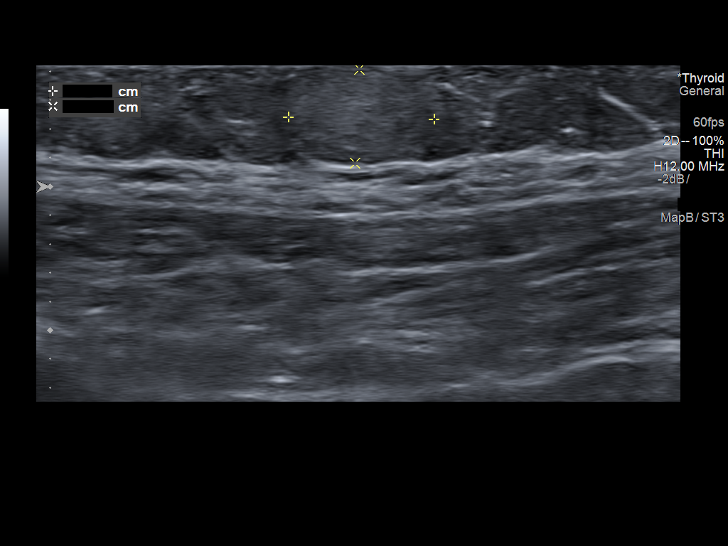
[im 64/70]
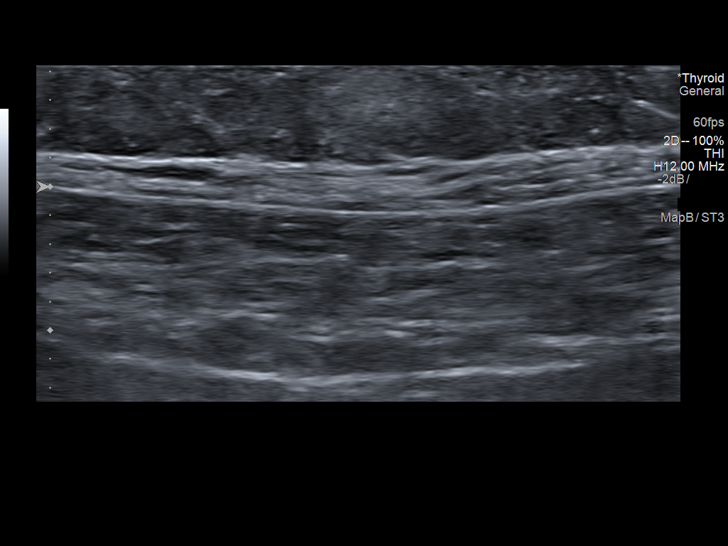
[im 70/70]
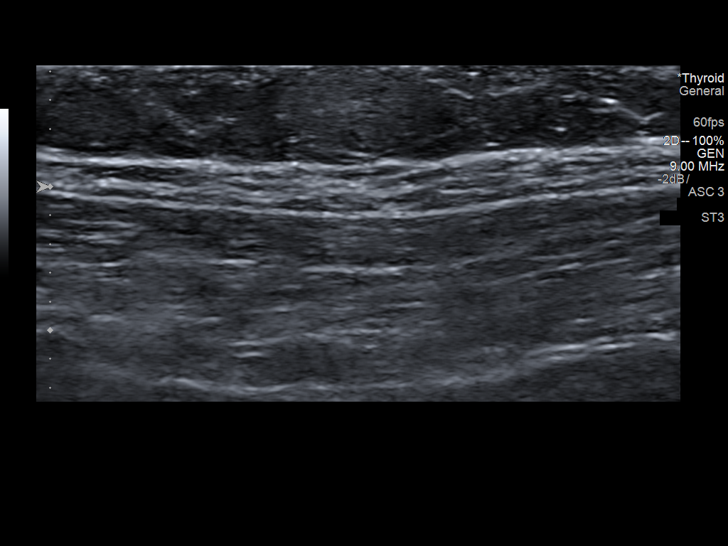

[14 of 25 positions shown; findings below may reference images not displayed]

FINDINGS: Limited soft tissue ultrasound abdomen performed in the region of
clinical concern. In the region of the palpable abnormality is a
hyperechoic area measuring 1 x 0.7 x 1.5 cm. No cysts are visualized
in the subcutaneous soft tissues.
IMPRESSION: Nonspecific focal hyperechoic area measuring 1.5 cm corresponding to
the area clinical concern, findings are nonspecific and could relate
to lipoma. Further management will need to be based on clinical
grounds.
